# Patient Record
Sex: Male | Born: 1995
Health system: Southern US, Community
[De-identification: ages and names within clinical notes are randomized; demographics above are authoritative.]

## PROBLEM LIST (undated history)

## (undated) DIAGNOSIS — J301 Allergic rhinitis due to pollen: Secondary | ICD-10-CM

## (undated) DIAGNOSIS — F909 Attention-deficit hyperactivity disorder, unspecified type: Secondary | ICD-10-CM

## (undated) HISTORY — DX: Attention-deficit hyperactivity disorder, unspecified type: F90.9

## (undated) HISTORY — DX: Allergic rhinitis due to pollen: J30.1

---
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
## 2013-01-19 ENCOUNTER — Other Ambulatory Visit: Payer: Self-pay

## 2013-01-19 DIAGNOSIS — F909 Attention-deficit hyperactivity disorder, unspecified type: Secondary | ICD-10-CM

## 2013-01-19 MED ORDER — METHYLPHENIDATE HCL ER (OSM) 54 MG PO TBCR: 54.0000 mg | EXTENDED_RELEASE_TABLET | ORAL | Status: DC

## 2013-01-19 NOTE — Telephone Encounter (Signed)
Spoke with patient and advised rx ready for pick-up and it will be at the front desk.  

## 2013-01-19 NOTE — Telephone Encounter (Signed)
pts father left v/m requesting rx concerta. Call when ready for pick up.

## 2013-03-05 ENCOUNTER — Other Ambulatory Visit: Payer: Self-pay

## 2013-03-05 DIAGNOSIS — F909 Attention-deficit hyperactivity disorder, unspecified type: Secondary | ICD-10-CM

## 2013-03-05 NOTE — Telephone Encounter (Signed)
pts father left v/m requesting rx concerta. Call when ready for pick up.

## 2013-03-08 MED ORDER — METHYLPHENIDATE HCL ER (OSM) 54 MG PO TBCR: 54.0000 mg | EXTENDED_RELEASE_TABLET | ORAL | Status: DC

## 2013-03-08 NOTE — Telephone Encounter (Signed)
Left message on machine that rx is ready for pick-up, and it will be at our front desk.  

## 2013-04-12 ENCOUNTER — Other Ambulatory Visit: Payer: Self-pay

## 2013-04-12 DIAGNOSIS — F909 Attention-deficit hyperactivity disorder, unspecified type: Secondary | ICD-10-CM

## 2013-04-12 MED ORDER — METHYLPHENIDATE HCL ER (OSM) 54 MG PO TBCR
54.0000 mg | EXTENDED_RELEASE_TABLET | ORAL | Status: DC
Start: 2013-04-12 — End: 2013-05-13

## 2013-04-12 NOTE — Telephone Encounter (Signed)
pts father left v/m requesting rx concerta. Call when ready for pick up.

## 2013-04-13 NOTE — Telephone Encounter (Signed)
Tried calling home number, no answer and VM was full, will leave rx up front.

## 2013-05-13 ENCOUNTER — Other Ambulatory Visit: Payer: Self-pay

## 2013-05-13 DIAGNOSIS — F909 Attention-deficit hyperactivity disorder, unspecified type: Secondary | ICD-10-CM

## 2013-05-13 MED ORDER — METHYLPHENIDATE HCL ER (OSM) 54 MG PO TBCR: 54.0000 mg | EXTENDED_RELEASE_TABLET | ORAL | Status: DC

## 2013-05-13 NOTE — Telephone Encounter (Signed)
pts mother left v/m requesting rx Concerta. Call when ready for pick up.

## 2013-05-14 NOTE — Telephone Encounter (Signed)
Spoke with patient and advised rx ready for pick-up and it will be at the front desk.  

## 2013-06-16 ENCOUNTER — Other Ambulatory Visit: Payer: Self-pay

## 2013-06-16 ENCOUNTER — Encounter: Payer: 59 | Admitting: Internal Medicine

## 2013-06-16 DIAGNOSIS — Z0289 Encounter for other administrative examinations: Secondary | ICD-10-CM

## 2013-06-16 DIAGNOSIS — F909 Attention-deficit hyperactivity disorder, unspecified type: Secondary | ICD-10-CM

## 2013-06-16 MED ORDER — METHYLPHENIDATE HCL ER (OSM) 54 MG PO TBCR: 54.0000 mg | EXTENDED_RELEASE_TABLET | ORAL | Status: DC

## 2013-06-16 NOTE — Telephone Encounter (Signed)
Lm on pts vm informing him Rx is available at the front desk for pickup/ Pt advised he must pick up Rx as substance contract must be renewed

## 2013-06-16 NOTE — Telephone Encounter (Signed)
Printed.  Thanks.  

## 2013-06-16 NOTE — Telephone Encounter (Signed)
pts father left v/m requesting rx concerta; call when ready for pick up.

## 2013-06-17 ENCOUNTER — Encounter: Payer: Self-pay | Admitting: Internal Medicine

## 2013-07-12 ENCOUNTER — Encounter: Payer: Self-pay | Admitting: Internal Medicine

## 2013-07-20 ENCOUNTER — Other Ambulatory Visit: Payer: Self-pay | Admitting: *Deleted

## 2013-07-20 MED ORDER — METHYLPHENIDATE HCL ER (OSM) 54 MG PO TBCR: 54.0000 mg | EXTENDED_RELEASE_TABLET | ORAL | Status: DC

## 2013-07-20 NOTE — Telephone Encounter (Signed)
Request refill on Concerta. Call when ready for pickup.

## 2013-07-20 NOTE — Telephone Encounter (Signed)
Rx given to dad by Bonita QuinLinda She let him know that I cannot do anymore refills till he is back in the office for a visit

## 2013-07-26 ENCOUNTER — Encounter: Payer: Self-pay | Admitting: Internal Medicine

## 2013-07-26 ENCOUNTER — Encounter (INDEPENDENT_AMBULATORY_CARE_PROVIDER_SITE_OTHER): Payer: Self-pay

## 2013-07-26 ENCOUNTER — Ambulatory Visit (INDEPENDENT_AMBULATORY_CARE_PROVIDER_SITE_OTHER): Payer: 59 | Admitting: Internal Medicine

## 2013-07-26 VITALS — BP 128/80 | HR 83 | Temp 98.3°F | Ht 72.5 in | Wt 235.0 lb

## 2013-07-26 DIAGNOSIS — F900 Attention-deficit hyperactivity disorder, predominantly inattentive type: Secondary | ICD-10-CM

## 2013-07-26 DIAGNOSIS — F909 Attention-deficit hyperactivity disorder, unspecified type: Secondary | ICD-10-CM

## 2013-07-26 DIAGNOSIS — Z0000 Encounter for general adult medical examination without abnormal findings: Secondary | ICD-10-CM

## 2013-07-26 NOTE — Patient Instructions (Signed)
Exercise to Lose Weight Exercise and a healthy diet may help you lose weight. Your doctor may suggest specific exercises. EXERCISE IDEAS AND TIPS  Choose low-cost things you enjoy doing, such as walking, bicycling, or exercising to workout videos.  Take stairs instead of the elevator.  Walk during your lunch break.  Park your car further away from work or school.  Go to a gym or an exercise class.  Start with 5 to 10 minutes of exercise each day. Build up to 30 minutes of exercise 4 to 6 days a week.  Wear shoes with good support and comfortable clothes.  Stretch before and after working out.  Work out until you breathe harder and your heart beats faster.  Drink extra water when you exercise.  Do not do so much that you hurt yourself, feel dizzy, or get very short of breath. Exercises that burn about 150 calories:  Running 1  miles in 15 minutes.  Playing volleyball for 45 to 60 minutes.  Washing and waxing a car for 45 to 60 minutes.  Playing touch football for 45 minutes.  Walking 1  miles in 35 minutes.  Pushing a stroller 1  miles in 30 minutes.  Playing basketball for 30 minutes.  Raking leaves for 30 minutes.  Bicycling 5 miles in 30 minutes.  Walking 2 miles in 30 minutes.  Dancing for 30 minutes.  Shoveling snow for 15 minutes.  Swimming laps for 20 minutes.  Walking up stairs for 15 minutes.  Bicycling 4 miles in 15 minutes.  Gardening for 30 to 45 minutes.  Jumping rope for 15 minutes.  Washing windows or floors for 45 to 60 minutes. Document Released: 01/26/2010 Document Revised: 03/18/2011 Document Reviewed: 01/26/2010 ExitCare Patient Information 2015 ExitCare, LLC. This information is not intended to replace advice given to you by your health care provider. Make sure you discuss any questions you have with your health care provider.   DASH Eating Plan DASH stands for "Dietary Approaches to Stop Hypertension." The DASH eating plan  is a healthy eating plan that has been shown to reduce high blood pressure (hypertension). Additional health benefits may include reducing the risk of type 2 diabetes mellitus, heart disease, and stroke. The DASH eating plan may also help with weight loss. WHAT DO I NEED TO KNOW ABOUT THE DASH EATING PLAN? For the DASH eating plan, you will follow these general guidelines:  Choose foods with a percent daily value for sodium of less than 5% (as listed on the food label).  Use salt-free seasonings or herbs instead of table salt or sea salt.  Check with your health care provider or pharmacist before using salt substitutes.  Eat lower-sodium products, often labeled as "lower sodium" or "no salt added."  Eat fresh foods.  Eat more vegetables, fruits, and low-fat dairy products.  Choose whole grains. Look for the word "whole" as the first word in the ingredient list.  Choose fish and skinless chicken or turkey more often than red meat. Limit fish, poultry, and meat to 6 oz (170 g) each day.  Limit sweets, desserts, sugars, and sugary drinks.  Choose heart-healthy fats.  Limit cheese to 1 oz (28 g) per day.  Eat more home-cooked food and less restaurant, buffet, and fast food.  Limit fried foods.  Cook foods using methods other than frying.  Limit canned vegetables. If you do use them, rinse them well to decrease the sodium.  When eating at a restaurant, ask that your food be   prepared with less salt, or no salt if possible. WHAT FOODS CAN I EAT? Seek help from a dietitian for individual calorie needs. Grains Whole grain or whole wheat bread. Brown rice. Whole grain or whole wheat pasta. Quinoa, bulgur, and whole grain cereals. Low-sodium cereals. Corn or whole wheat flour tortillas. Whole grain cornbread. Whole grain crackers. Low-sodium crackers. Vegetables Fresh or frozen vegetables (raw, steamed, roasted, or grilled). Low-sodium or reduced-sodium tomato and vegetable juices.  Low-sodium or reduced-sodium tomato sauce and paste. Low-sodium or reduced-sodium canned vegetables.  Fruits All fresh, canned (in natural juice), or frozen fruits. Meat and Other Protein Products Ground beef (85% or leaner), grass-fed beef, or beef trimmed of fat. Skinless chicken or turkey. Ground chicken or turkey. Pork trimmed of fat. All fish and seafood. Eggs. Dried beans, peas, or lentils. Unsalted nuts and seeds. Unsalted canned beans. Dairy Low-fat dairy products, such as skim or 1% milk, 2% or reduced-fat cheeses, low-fat ricotta or cottage cheese, or plain low-fat yogurt. Low-sodium or reduced-sodium cheeses. Fats and Oils Tub margarines without trans fats. Light or reduced-fat mayonnaise and salad dressings (reduced sodium). Avocado. Safflower, olive, or canola oils. Natural peanut or almond butter. Other Unsalted popcorn and pretzels. The items listed above may not be a complete list of recommended foods or beverages. Contact your dietitian for more options. WHAT FOODS ARE NOT RECOMMENDED? Grains White bread. White pasta. White rice. Refined cornbread. Bagels and croissants. Crackers that contain trans fat. Vegetables Creamed or fried vegetables. Vegetables in a cheese sauce. Regular canned vegetables. Regular canned tomato sauce and paste. Regular tomato and vegetable juices. Fruits Dried fruits. Canned fruit in light or heavy syrup. Fruit juice. Meat and Other Protein Products Fatty cuts of meat. Ribs, chicken wings, bacon, sausage, bologna, salami, chitterlings, fatback, hot dogs, bratwurst, and packaged luncheon meats. Salted nuts and seeds. Canned beans with salt. Dairy Whole or 2% milk, cream, half-and-half, and cream cheese. Whole-fat or sweetened yogurt. Full-fat cheeses or blue cheese. Nondairy creamers and whipped toppings. Processed cheese, cheese spreads, or cheese curds. Condiments Onion and garlic salt, seasoned salt, table salt, and sea salt. Canned and packaged  gravies. Worcestershire sauce. Tartar sauce. Barbecue sauce. Teriyaki sauce. Soy sauce, including reduced sodium. Steak sauce. Fish sauce. Oyster sauce. Cocktail sauce. Horseradish. Ketchup and mustard. Meat flavorings and tenderizers. Bouillon cubes. Hot sauce. Tabasco sauce. Marinades. Taco seasonings. Relishes. Fats and Oils Butter, stick margarine, lard, shortening, ghee, and bacon fat. Coconut, palm kernel, or palm oils. Regular salad dressings. Other Pickles and olives. Salted popcorn and pretzels. The items listed above may not be a complete list of foods and beverages to avoid. Contact your dietitian for more information. WHERE CAN I FIND MORE INFORMATION? National Heart, Lung, and Blood Institute: www.nhlbi.nih.gov/health/health-topics/topics/dash/ Document Released: 12/13/2010 Document Revised: 12/29/2012 Document Reviewed: 10/28/2012 ExitCare Patient Information 2015 ExitCare, LLC. This information is not intended to replace advice given to you by your health care provider. Make sure you discuss any questions you have with your health care provider.  

## 2013-07-26 NOTE — Progress Notes (Signed)
Subjective:    Patient ID: Gerald Yang, male    DOB: 19-Mar-1995, 18 y.o.   MRN: [id number]  HPI Here for physical Finished first year at [employer] Did okay but did fail 2 courses Takes the med daily for school and as needed for weekends  Home now Working part time at [employer]  No social issues Discussed avoiding drugs and alcohol. Discussed safety and safe sex  Current Outpatient Prescriptions on File Prior to Visit  Medication Sig Dispense Refill  . methylphenidate 54 MG PO CR tablet Take 1 tablet (54 mg total) by mouth every morning.  30 tablet  0   No current facility-administered medications on file prior to visit.    No Known Allergies  Past Medical History  Diagnosis Date  . ADHD (attention deficit hyperactivity disorder)     Inattentive type  . Allergic rhinitis due to pollen     Past Surgical History  Procedure Laterality Date  . Lipoma excision  2012    in neck    Family History  Problem Relation Age of Onset  . Mitral valve prolapse Father   . Allergies Father   . Allergies Sister   . Diabetes Paternal Aunt   . Diabetes Paternal Grandmother 90  . Heart disease Neg Hx   . Cancer Other     lung cancer--?asbestosis    History   Social History  . Marital Status: Single    Spouse Name: N/A    Number of Children: N/A  . Years of Education: N/A   Occupational History  . Not on file.   Social History Main Topics  . Smoking status: Never Smoker   . Smokeless tobacco: Never Used  . Alcohol Use: No  . Drug Use: No  . Sexual Activity: No   Other Topics Concern  . 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       Discussed fitness-- he doesn't do any. Wears seat belt  HENT: Negative for dental problem, hearing loss and tinnitus.        Regular with dentist  Eyes:  Negative for visual disturbance.       No diplopia or unilateral vision loss  Respiratory: Negative for cough, chest tightness and shortness of breath.   Cardiovascular: Negative for chest pain, palpitations and leg swelling.  Gastrointestinal: Negative for nausea, vomiting, abdominal pain, constipation and blood in stool.  Endocrine: Negative for cold intolerance and heat intolerance.  Genitourinary: Negative for urgency, frequency and difficulty urinating.  Musculoskeletal: Negative for arthralgias, back pain and joint swelling.  Skin: Positive for rash.       Long standing eczema--- uses eucerin  Allergic/Immunologic: Positive for environmental allergies. Negative for immunocompromised state.       Cetirizine for spring  Neurological: Negative for dizziness, syncope, weakness, light-headedness, numbness and headaches.  Hematological: Negative for adenopathy. Does not bruise/bleed easily.  Psychiatric/Behavioral: Negative for sleep disturbance and dysphoric mood. The patient is not nervous/anxious.        Objective:   Physical Exam  Constitutional: He is oriented to person, place, and time. He appears well-developed and well-nourished. No distress.  HENT:  Head: Normocephalic and atraumatic.  Right Ear: External ear normal.  Left Ear: External ear normal.  Mouth/Throat: Oropharynx is clear and moist. No oropharyngeal exudate.  Eyes: Conjunctivae and  EOM are normal. Pupils are equal, round, and reactive to light.  Neck: Normal range of motion. Neck supple. No thyromegaly present.  Cardiovascular: Normal rate, regular rhythm, normal heart sounds and intact distal pulses.  Exam reveals no gallop.   No murmur heard. Pulmonary/Chest: Effort normal and breath sounds normal. No respiratory distress. He has no wheezes. He has no rales.  Abdominal: Soft. There is no tenderness.  Musculoskeletal: He exhibits no edema and no tenderness.  Lymphadenopathy:    He has no cervical adenopathy.    Neurological: He is alert and oriented to person, place, and time.  Skin: No rash noted. No erythema.  Psychiatric: He has a normal mood and affect. His behavior is normal.          Assessment & Plan:

## 2013-07-26 NOTE — Progress Notes (Signed)
Pre visit review using our clinic review tool, if applicable. No additional management support is needed unless otherwise documented below in the visit note. 

## 2013-07-26 NOTE — Assessment & Plan Note (Signed)
Needs for work now--and then for school Tolerates well and it helps

## 2013-07-26 NOTE — Assessment & Plan Note (Signed)
Healthy but needs to work on fitness Info given

## 2013-08-20 ENCOUNTER — Other Ambulatory Visit: Payer: Self-pay

## 2013-08-20 NOTE — Telephone Encounter (Signed)
pts mother left v/m requesting rx concerta. Call when ready for pick up. 

## 2013-08-23 MED ORDER — METHYLPHENIDATE HCL ER (OSM) 54 MG PO TBCR: 54.0000 mg | EXTENDED_RELEASE_TABLET | ORAL | Status: DC

## 2013-08-23 NOTE — Telephone Encounter (Signed)
.  left message to have patient return my call.  

## 2013-09-30 ENCOUNTER — Other Ambulatory Visit: Payer: Self-pay

## 2013-09-30 NOTE — Telephone Encounter (Signed)
pts mother left v/m requesting rx concerta. Call when ready for pick up. 

## 2013-10-01 MED ORDER — METHYLPHENIDATE HCL ER (OSM) 54 MG PO TBCR: 54.0000 mg | EXTENDED_RELEASE_TABLET | ORAL | Status: DC

## 2013-10-01 NOTE — Telephone Encounter (Signed)
Spoke with patient and advised rx ready for pick-up and it will be at the front desk.  

## 2013-11-01 ENCOUNTER — Other Ambulatory Visit: Payer: Self-pay

## 2013-11-01 NOTE — Telephone Encounter (Signed)
pts mother left v/m requesting rx concerta. Call when ready for pick up.

## 2013-11-02 MED ORDER — METHYLPHENIDATE HCL ER (OSM) 54 MG PO TBCR: 54.0000 mg | EXTENDED_RELEASE_TABLET | ORAL | Status: DC

## 2013-11-02 NOTE — Telephone Encounter (Signed)
Left message on machine that rx is ready for pick-up, and it will be at our front desk.  

## 2013-12-06 ENCOUNTER — Other Ambulatory Visit: Payer: Self-pay

## 2013-12-06 MED ORDER — METHYLPHENIDATE HCL ER (OSM) 54 MG PO TBCR: 54.0000 mg | EXTENDED_RELEASE_TABLET | ORAL | Status: DC

## 2013-12-06 NOTE — Telephone Encounter (Signed)
pts mother Renetta left v/m requesting rx concerta. Call when ready for pick up.

## 2013-12-07 NOTE — Telephone Encounter (Signed)
Left message on machine that rx is ready for pick-up, and it will be at our front desk.  

## 2014-01-14 ENCOUNTER — Other Ambulatory Visit: Payer: Self-pay

## 2014-01-14 MED ORDER — METHYLPHENIDATE HCL ER (OSM) 54 MG PO TBCR: 54.0000 mg | EXTENDED_RELEASE_TABLET | ORAL | Status: DC

## 2014-01-14 NOTE — Telephone Encounter (Signed)
Spoke with patient and advised rx ready for pick-up and it will be at the front desk.  

## 2014-01-14 NOTE — Telephone Encounter (Signed)
pts mother left v/m requesting rx for concerta. Call when ready for pick up. Pt has 6 mth f/u appt 01/20/2014.

## 2014-01-20 ENCOUNTER — Encounter: Payer: Self-pay | Admitting: Internal Medicine

## 2014-01-20 ENCOUNTER — Ambulatory Visit (INDEPENDENT_AMBULATORY_CARE_PROVIDER_SITE_OTHER): Payer: Self-pay | Admitting: Internal Medicine

## 2014-01-20 VITALS — BP 128/84 | HR 96 | Temp 98.5°F | Wt 228.8 lb

## 2014-01-20 DIAGNOSIS — F900 Attention-deficit hyperactivity disorder, predominantly inattentive type: Secondary | ICD-10-CM

## 2014-01-20 NOTE — Progress Notes (Signed)
Pre visit review using our clinic review tool, if applicable. No additional management support is needed unless otherwise documented below in the visit note. 

## 2014-01-20 NOTE — Assessment & Plan Note (Signed)
Failed at [employer] Now going to [employer] May be better suited for vocational trade program---urged him to consider this and meet with vocational counselor

## 2014-01-20 NOTE — Progress Notes (Signed)
   Subjective:    Patient ID: Gerald Yang, male    DOB: Aug 28, 1995, 19 y.o.   MRN: [id number]  HPI Here for follow up of ADHD  Did do the fall semester at [employer] but didn't do well Failed everything and is taking the spring off "It was a tough schedule that I just wasn't ready for"  Working now-- or looking Will start at [employer] for general courses and hope to go back if things have improved  No drugs or alcohol No relationship issues  Has continued to use the medication but doesn't relate his failure to the inattention Discussed vocational education--maybe he should be training for a trade  Current Outpatient Prescriptions on File Prior to Visit  Medication Sig Dispense Refill  . methylphenidate 54 MG PO CR tablet Take 1 tablet (54 mg total) by mouth every morning. 30 tablet 0   No current facility-administered medications on file prior to visit.    No Known Allergies  Past Medical History  Diagnosis Date  . ADHD (attention deficit hyperactivity disorder)     Inattentive type  . Allergic rhinitis due to pollen     Past Surgical History  Procedure Laterality Date  . Lipoma excision  2012    in neck    Family History  Problem Relation Age of Onset  . Mitral valve prolapse Father   . Allergies Father   . Allergies Sister   . Diabetes Paternal Aunt   . Diabetes Paternal Grandmother [id number]  . Heart disease Neg Hx   . Cancer Other     lung cancer--?asbestosis    History   Social History  . Marital Status: Single    Spouse Name: N/A    Number of Children: N/A  . Years of Education: N/A   Occupational History  . Not on file.   Social History Main Topics  . Smoking status: Never Smoker   . Smokeless tobacco: Never Used  . Alcohol Use: No  . Drug Use: No  . Sexual Activity: No   Other Topics Concern  . 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 His behavior is normal. Thought content normal.          Assessment & Plan:

## 2014-01-24 ENCOUNTER — Ambulatory Visit: Payer: Self-pay | Admitting: Internal Medicine

## 2014-01-26 ENCOUNTER — Encounter: Payer: Self-pay | Admitting: Family Medicine

## 2014-01-26 ENCOUNTER — Ambulatory Visit (INDEPENDENT_AMBULATORY_CARE_PROVIDER_SITE_OTHER): Payer: 59 | Admitting: Family Medicine

## 2014-01-26 VITALS — BP 110/60 | HR 95 | Temp 98.8°F | Ht 72.5 in | Wt 229.8 lb

## 2014-01-26 DIAGNOSIS — L723 Sebaceous cyst: Secondary | ICD-10-CM

## 2014-01-26 NOTE — Progress Notes (Signed)
   Dr. Karleen HampshireSpencer T. Gao Mitnick, MD, CAQ Sports [facility] [address] [place], [id number] Phone: [phone] Fax: [phone]  01/26/2014  Patient: Gerald SchmidtJonathan A Yang, MRN: [id number], DOB: 05-Feb-1995, 19 y.o.  Primary Physician:  Tillman Abideichard Letvak, MD  Chief Complaint: Spot on Groin  Subjective:   Gerald Yang is a 19 y.o. very pleasant male patient who presents with the following:  Over the last week, the patient is noticed a large mid or area on his groin on the right, and he has never seen this before.  He has no known sexual exposures. He has never been sexually active. It hurts mildly. It is not an open sore.  Past Medical History, Surgical History, Social History, Family History, Problem List, Medications, and Allergies have been reviewed and updated if relevant.   GEN: No acute illnesses, no fevers, chills. GI: No n/v/d, eating normally Pulm: No SOB Interactive and getting along well at home.  Otherwise, ROS is as per the HPI.  Objective:   BP 110/60 mmHg  Pulse 95  Temp(Src) 98.8 F (37.1 C) (Oral)  Ht 6' 0.5" (1.842 m)  Wt 229 lb 12 oz (104.214 kg)  BMI 30.71 kg/m2   GEN: WDWN, NAD, Non-toxic, Alert & Oriented x 3 HEENT: Atraumatic, Normocephalic.  Ears and Nose: No external deformity. EXTR: No clubbing/cyanosis/edema NEURO: Normal gait.  PSYCH: Normally interactive. Conversant. Not depressed or anxious appearing.  Calm demeanor.  GU: normal male. On the right side of the patient's testicular sac there is a. It is approximately the size of a pea that is elevated and mildly tender to palpation. There is no significant adjacent redness or inflammation.  Laboratory and Imaging Data:  Assessment and Plan:   Sebaceous cyst of scrotum  Relatively small and large sebaceous cyst, I think it can be followed, and treated conservatively. Recommended warm, moist compresses multiple times a day to the area without clothing.  At this point, do not think that it needs to be opened or needs antibiotics.  Reassured the patient. If this gets quite a bit larger or more painful, then more intervention would be appropriate.  Signed,  Elpidio GaleaSpencer T. Indi Willhite, MD   Patient's Medications  New Prescriptions   No medications on file  Previous Medications   METHYLPHENIDATE 54 MG PO CR TABLET    Take 1 tablet (54 mg total) by mouth every morning.  Modified Medications   No medications on file  Discontinued Medications   No medications on file

## 2014-01-26 NOTE — Progress Notes (Signed)
Pre visit review using our clinic review tool, if applicable. No additional management support is needed unless otherwise documented below in the visit note. 

## 2014-02-11 ENCOUNTER — Other Ambulatory Visit: Payer: Self-pay

## 2014-02-11 NOTE — Telephone Encounter (Signed)
Pt left v/m requesting rx concerta.call when ready for pick up.pt seen 01/20/14.

## 2014-02-12 NOTE — Telephone Encounter (Signed)
Approved: okay to prepare Rx #[id number] x 0 and I will sign when I get in on Monday morning

## 2014-02-14 MED ORDER — METHYLPHENIDATE HCL ER (OSM) 54 MG PO TBCR: 54.0000 mg | EXTENDED_RELEASE_TABLET | ORAL | Status: DC

## 2014-02-14 NOTE — Telephone Encounter (Signed)
Left message on voicemail [facility] that script is up front ready for pickup.

## 2014-02-14 NOTE — Telephone Encounter (Signed)
Rx printed and will have Dr Alphonsus SiasLetvak sign before calling pt

## 2014-08-10 ENCOUNTER — Encounter: Payer: Self-pay | Admitting: Internal Medicine

## 2014-08-10 ENCOUNTER — Telehealth: Payer: Self-pay | Admitting: Internal Medicine

## 2014-08-10 NOTE — Telephone Encounter (Signed)
Patient did not come in for their appointment today for CPE .  Please let me know if patient needs to be contacted immediately for follow up or no follow up needed. °

## 2014-08-11 NOTE — Telephone Encounter (Signed)
He needs to reschedule the PE within the next 1 month or so--- we need to review his prescription

## 2014-08-15 NOTE — Telephone Encounter (Signed)
L/m for pt to return call, need to rs cpe

## 2014-08-18 NOTE — Telephone Encounter (Signed)
Called to r/s appt, no answer, kept ringing  °

## 2014-08-23 NOTE — Telephone Encounter (Signed)
L/m for pt to return call to rs appt

## 2016-01-14 ENCOUNTER — Emergency Department (HOSPITAL_BASED_OUTPATIENT_CLINIC_OR_DEPARTMENT_OTHER)
Admission: EM | Admit: 2016-01-14 | Discharge: 2016-01-14 | Disposition: A | Discharge status: Home / Self Care | Payer: Commercial Managed Care - HMO | Attending: Emergency Medicine | Admitting: Emergency Medicine

## 2016-01-14 ENCOUNTER — Encounter (HOSPITAL_BASED_OUTPATIENT_CLINIC_OR_DEPARTMENT_OTHER): Payer: Self-pay | Admitting: *Deleted

## 2016-01-14 ENCOUNTER — Emergency Department (HOSPITAL_BASED_OUTPATIENT_CLINIC_OR_DEPARTMENT_OTHER): Payer: Commercial Managed Care - HMO

## 2016-01-14 DIAGNOSIS — R1030 Lower abdominal pain, unspecified: Secondary | ICD-10-CM | POA: Diagnosis present

## 2016-01-14 DIAGNOSIS — F900 Attention-deficit hyperactivity disorder, predominantly inattentive type: Secondary | ICD-10-CM | POA: Insufficient documentation

## 2016-01-14 DIAGNOSIS — N200 Calculus of kidney: Secondary | ICD-10-CM | POA: Diagnosis not present

## 2016-01-14 LAB — BASIC METABOLIC PANEL
Anion gap: 9 (ref 5–15)
BUN: 16 mg/dL (ref 6–20)
CALCIUM: 9.6 mg/dL (ref 8.9–10.3)
CHLORIDE: 104 mmol/L (ref 101–111)
CO2: 27 mmol/L (ref 22–32)
CREATININE: 1.2 mg/dL (ref 0.61–1.24)
GFR calc Af Amer: >60 mL/min (ref >60)
GFR calc non Af Amer: >60 mL/min (ref >60)
Glucose, Bld: 101 mg/dL — ABNORMAL HIGH (ref 65–99)
Potassium: 4.3 mmol/L (ref 3.5–5.1)
Sodium: 140 mmol/L (ref 135–145)

## 2016-01-14 LAB — URINALYSIS, MICROSCOPIC (REFLEX): RBC / HPF: NONE SEEN RBC/hpf (ref 0–5)

## 2016-01-14 LAB — URINALYSIS, ROUTINE W REFLEX MICROSCOPIC
Bilirubin Urine: NEGATIVE
Glucose, UA: NEGATIVE mg/dL
HGB URINE DIPSTICK: NEGATIVE
Ketones, ur: NEGATIVE mg/dL
Leukocytes, UA: NEGATIVE
Nitrite: NEGATIVE
PROTEIN: NEGATIVE mg/dL
Specific Gravity, Urine: 1.036 — ABNORMAL HIGH (ref 1.005–1.030)
pH: 5 (ref 5.0–8.0)

## 2016-01-14 LAB — CBC WITH DIFFERENTIAL/PLATELET
BASOS PCT: 0 %
Basophils Absolute: 0 10*3/uL (ref 0.0–0.1)
EOS PCT: 0 %
Eosinophils Absolute: 0 10*3/uL (ref 0.0–0.7)
HCT: 44.7 % (ref 39.0–52.0)
Hemoglobin: 15.1 g/dL (ref 13.0–17.0)
LYMPHS ABS: 1.2 10*3/uL (ref 0.7–4.0)
Lymphocytes Relative: 7 %
MCH: 30.9 pg (ref 26.0–34.0)
MCHC: 33.8 g/dL (ref 30.0–36.0)
MCV: 91.4 fL (ref 78.0–100.0)
MONO ABS: 0.9 10*3/uL (ref 0.1–1.0)
MONOS PCT: 5 %
NEUTROS PCT: 88 %
Neutro Abs: 14.4 10*3/uL — ABNORMAL HIGH (ref 1.7–7.7)
PLATELETS: 230 10*3/uL (ref 150–400)
RBC: 4.89 MIL/uL (ref 4.22–5.81)
RDW: 12 % (ref 11.5–15.5)
WBC: 16.5 10*3/uL — ABNORMAL HIGH (ref 4.0–10.5)

## 2016-01-14 MED ORDER — HYDROCODONE-ACETAMINOPHEN 5-325 MG PO TABS: 1.0000 | ORAL_TABLET | Freq: Four times a day (QID) | ORAL | 0 refills | Status: DC | PRN

## 2016-01-14 NOTE — ED Triage Notes (Signed)
Left groin pain and urinary retention.  [facility] and zofran.   No blood in urine.

## 2016-01-14 NOTE — ED Notes (Signed)
Patient transported to Ultrasound 

## 2016-01-14 NOTE — ED Provider Notes (Signed)
[facility] Provider Note   CSN: [id number] Arrival date & time: 01/14/16  1812  By signing my name below, I, Teofilo Pod, attest that this documentation has been prepared under the direction and in the presence of Gwyneth Sprout, MD . Electronically Signed: Teofilo Pod, ED Scribe. 01/14/2016. 7:20 PM.    History   Chief Complaint Chief Complaint  Patient presents with  . Groin Pain    The history is provided by the patient. No language interpreter was used.   HPI Comments:  Gerald Yang is a 21 y.o. male who presents to the Emergency Department complaining of sudden onset, left sided abdominal pain x 5-6 hours. Pt states that the pain is non-radiating, and describes it as pressure in the groin area. Pt complains of associated minimal testicular pain, difficulty urinating, and 1 episode of vomiting at the onset of the pain. Pt denies any hx of previous similar symptoms. Pt was seen at urgent care and was given Toradol and zofran with mild relief. Pt denies penile pain.   Past Medical History:  Diagnosis Date  . ADHD (attention deficit hyperactivity disorder)    Inattentive type  . Allergic rhinitis due to pollen     Patient Active Problem List   Diagnosis Date Noted  . Routine general medical examination at a health care facility 11/20/2011  . Allergic rhinitis due to pollen   . ADHD, predominantly inattentive type 06/11/2010    Past Surgical History:  Procedure Laterality Date  . LIPOMA EXCISION  2012   in neck       Home Medications    Prior to Admission medications   Not on File    Family History Family History  Problem Relation Age of Onset  . Mitral valve prolapse Father   . Allergies Father   . Allergies Sister   . Diabetes Paternal Aunt   . Diabetes Paternal Grandmother 62  . Heart disease Neg Hx   . Cancer Other     lung cancer--?asbestosis    Social History Social History  Substance Use Topics  . Smoking  status: Never Smoker  . Smokeless tobacco: Never Used  . Alcohol use No     Allergies   Patient has no known allergies.   Review of Systems Review of Systems  Gastrointestinal: Positive for abdominal pain and vomiting.  Genitourinary: Positive for difficulty urinating and flank pain. Negative for penile pain.  All other systems reviewed and are negative.    Physical Exam Updated Vital Signs BP 127/89 (BP Location: Left Arm)   Pulse 75   Temp 98.4 F (36.9 C) (Oral)   Resp 20   Ht 6\' 1"  (1.854 m)   Wt 246 lb (111.6 kg)   SpO2 100%   BMI 32.46 kg/m   Physical Exam  Constitutional: He appears well-developed and well-nourished. No distress.  HENT:  Head: Normocephalic and atraumatic.  Eyes: Conjunctivae are normal.  Cardiovascular: Normal rate.   Pulmonary/Chest: Effort normal.  No CVA tenderness.   Abdominal: He exhibits no distension. There is no rebound and no guarding.  Neurological: He is alert.  Skin: Skin is warm and dry.  Psychiatric: He has a normal mood and affect.  Nursing note and vitals reviewed.    ED Treatments / Results  DIAGNOSTIC STUDIES:  Oxygen Saturation is 100% on RA, normal by my interpretation.    COORDINATION OF CARE:  7:20 PM Discussed treatment plan with pt at bedside and pt agreed to plan.  Labs (all labs ordered are listed, but only abnormal results are displayed) Labs Reviewed  URINALYSIS, ROUTINE W REFLEX MICROSCOPIC - Abnormal; Notable for the following:       Result Value   APPearance TURBID (*)    Specific Gravity, Urine 1.036 (*)    All other components within normal limits  URINALYSIS, MICROSCOPIC (REFLEX) - Abnormal; Notable for the following:    Bacteria, UA RARE (*)    Squamous Epithelial / LPF 0-5 (*)    All other components within normal limits    EKG  EKG Interpretation None       Radiology [place]al  Result Date: 01/14/2016 CLINICAL DATA:  Left flank pain radiates to left scrotum x 5 hours with  painful urination. Pain has since eased off. No hematuria. EXAM: RENAL / URINARY TRACT ULTRASOUND COMPLETE COMPARISON:  None. FINDINGS: Right Kidney: Length: 10.8 cm. Echogenicity within normal limits. No mass or hydronephrosis visualized. Left Kidney: Length: 12.3 cm. Normal parenchymal echogenicity. No mass or stone. Mild hydronephrosis. Bladder: Mildly distended. No bladder mass or wall thickening. Bilateral ureteral jets noted. IMPRESSION: 1. Mild left hydronephrosis. This may reflect a mildly obstructing distal stone or a recently passed stone. 2. No other abnormalities. Electronically Signed   By: Amie Portlandavid  Ormond M.D.   On: 01/14/2016 19:55    Procedures Procedures (including critical care time)  Medications Ordered in ED Medications - No data to display   Initial Impression / Assessment and Plan / ED Course  I have reviewed the triage vital signs and the nursing notes.  Pertinent labs & imaging results that were available during my care of the patient were reviewed by me and considered in my medical decision making (see chart for details).  Clinical Course    Pt with symptoms consistent with kidney stone.  Denies infectious sx, or GI symptoms.  Low concern for diverticulitis and no risk factors or history suggestive of AAA.  No hx suggestive of GU source (discharge) and otherwise pt is healthy.  Will hydrate, treat pain and ensure no infection with UA, CBC, CMP and will get stone study to further eval.  UA without infection.  US with mild hydronephrosis.  Will d/c with pain control and urology f/u if stone does not pass   Final Clinical Impressions(s) / ED Diagnoses   Final diagnoses:  Kidney stone on left side    New Prescriptions New Prescriptions   HYDROCODONE-ACETAMINOPHEN (NORCO/VICODIN) 5-325 MG TABLET    Take 1-2 tablets by mouth every 6 (six) hours as needed.   I personally performed the services described in this documentation, which was scribed in my presence.  The  recorded information has been reviewed and considered.     Gwyneth SproutWhitney Man Effertz, MD 01/14/16 2005

## 2016-01-16 ENCOUNTER — Telehealth: Payer: Self-pay

## 2016-01-16 NOTE — Telephone Encounter (Signed)
Pt was seen 01/24/16 in ED with Kidney stone. Pt has had nausea and wants to know if can get med for nausea. Pt last saw Dr Alphonsus SiasLetvak 01/2014; pt did not want to make appt; pt to see urologist if does not get better; advised pt will need to contact urologist. Pt voiced understanding.

## 2017-08-18 IMAGING — US US RENAL
1 series · 14 of 25 positions shown · non-contrast
Comparison: None.

CLINICAL DATA: Left flank pain radiates to left scrotum x 5 hours
with painful urination. Pain has since eased off. No hematuria.

EXAM:
RENAL / URINARY TRACT ULTRASOUND COMPLETE

[Series 1: us renal · 0.27mm/px · 14 of 31 slices shown]
[im 1/31]
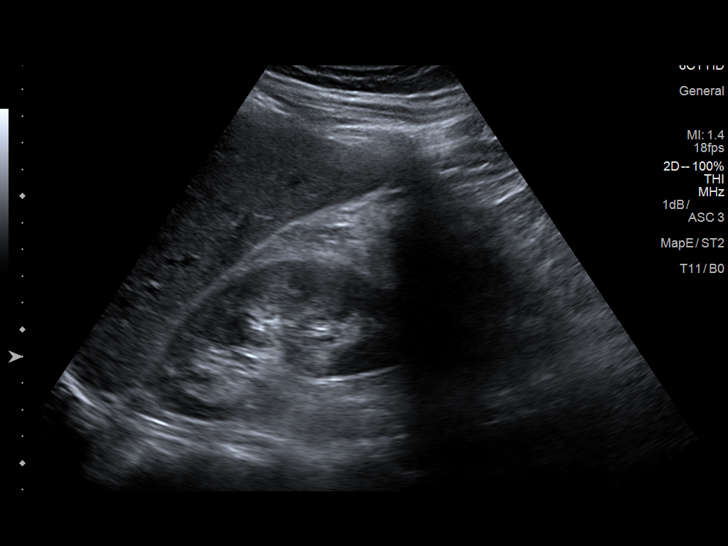
[im 3/31]
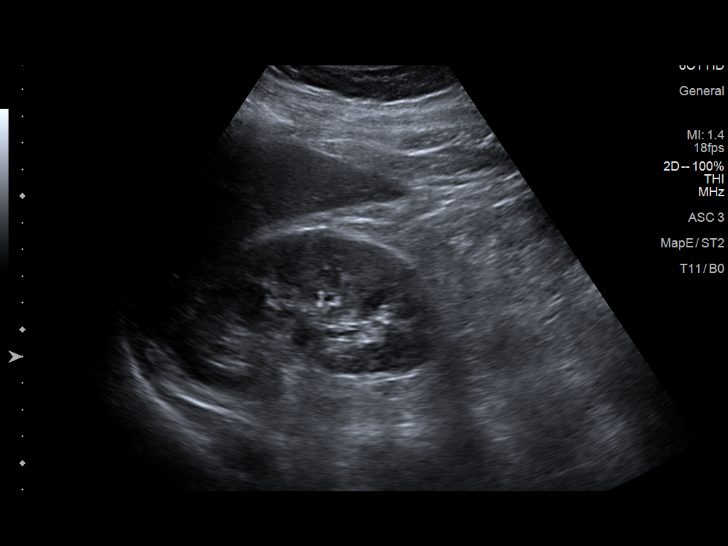
[im 6/31]
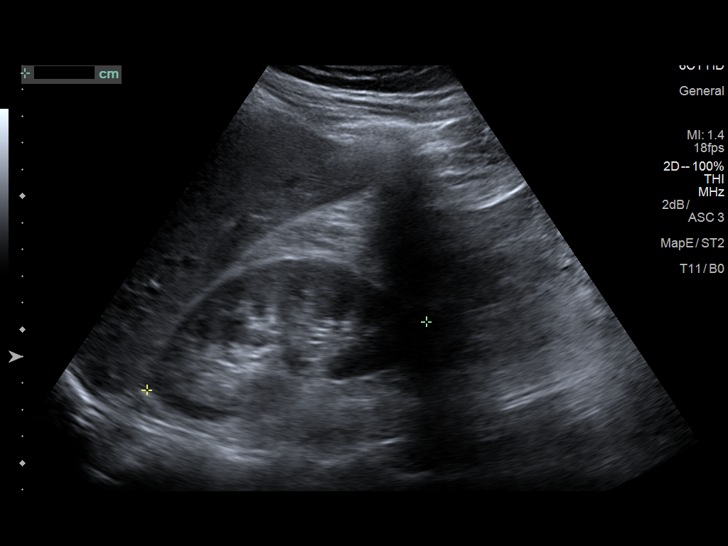
[im 8/31]
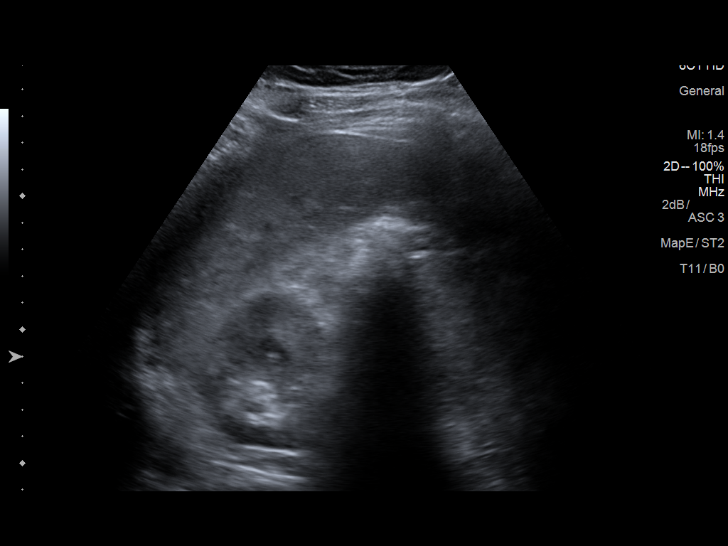
[im 11/31]
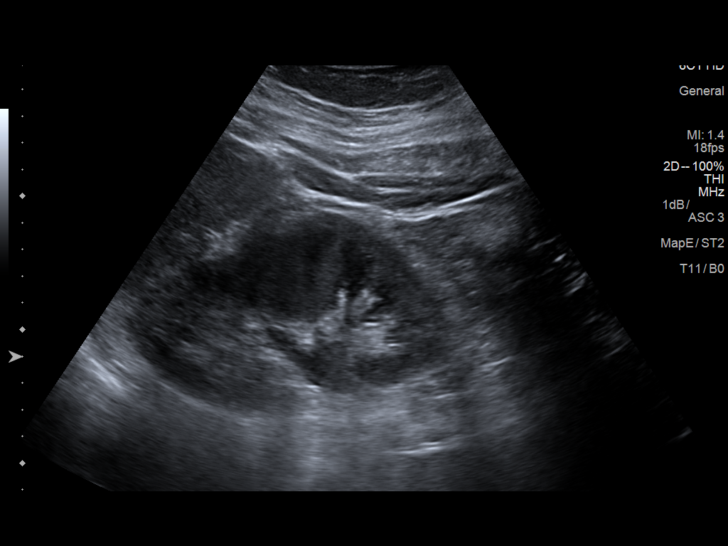
[im 12/31]
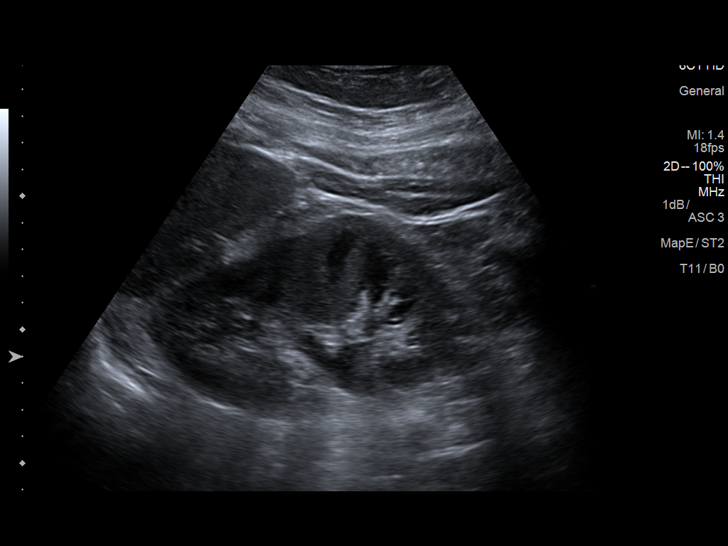
[im 14/31]
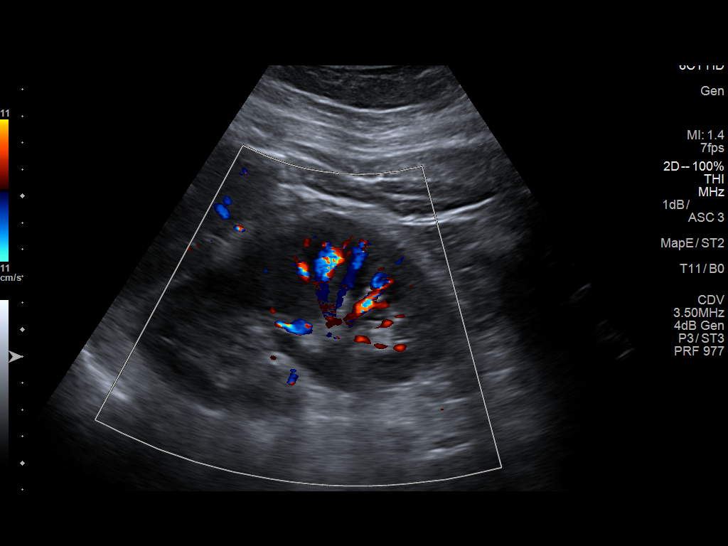
[im 17/31]
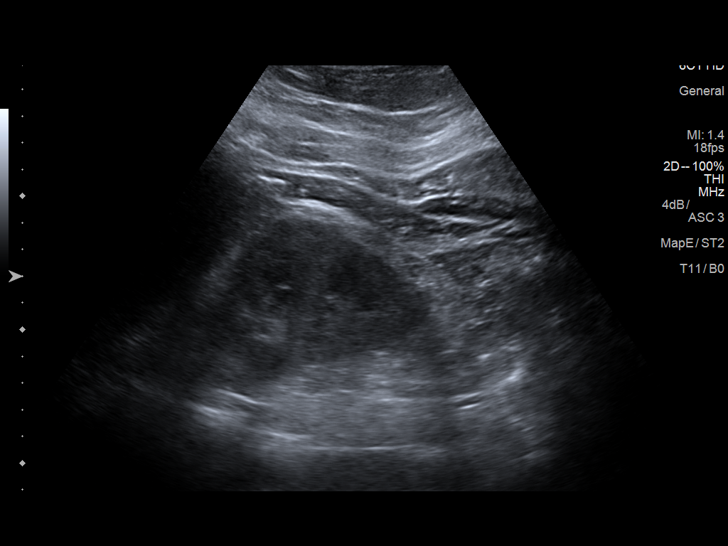
[im 19/31]
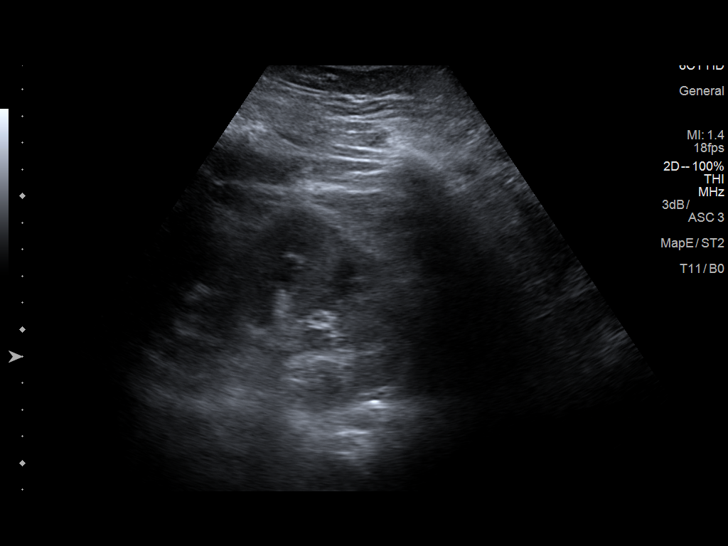
[im 21/31]
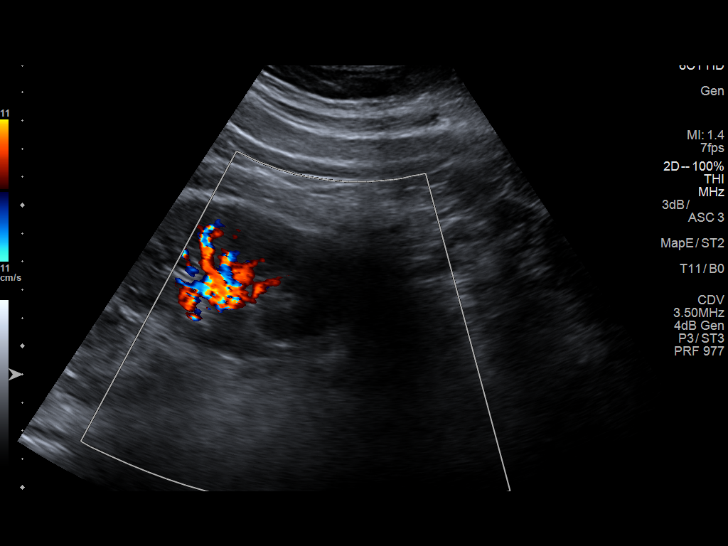
[im 23/31]
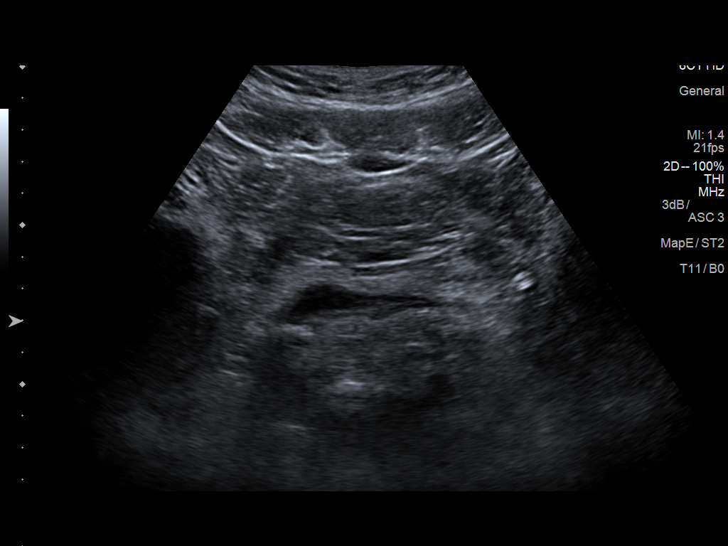
[im 26/31]
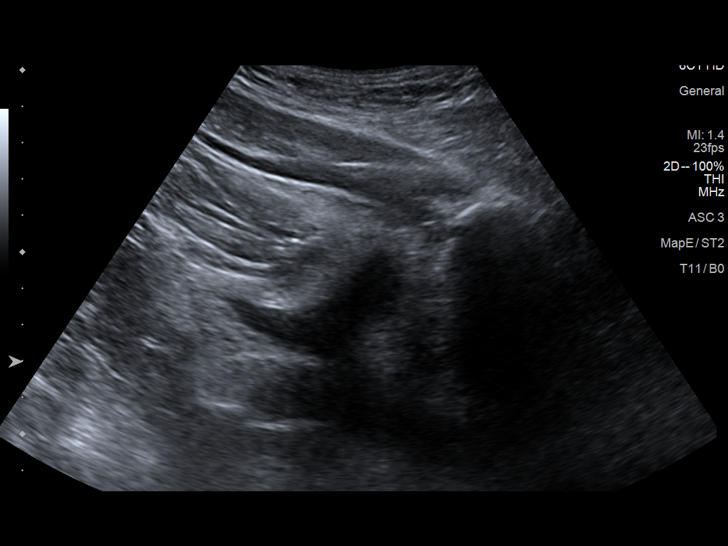
[im 28/31]
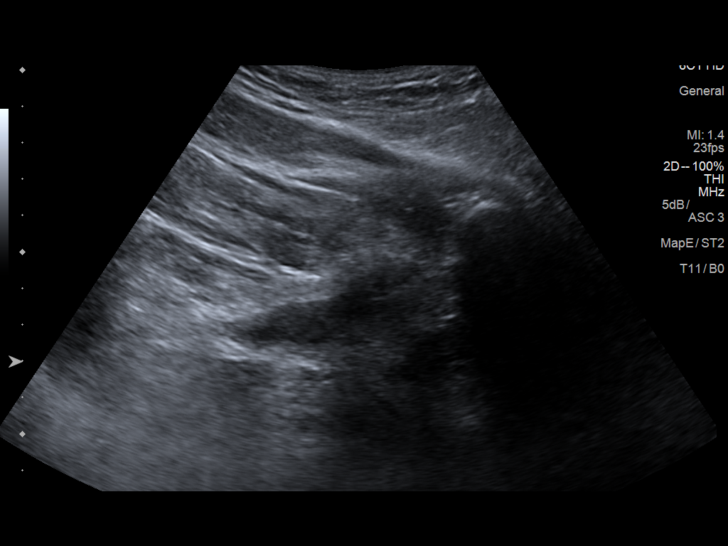
[im 31/31]
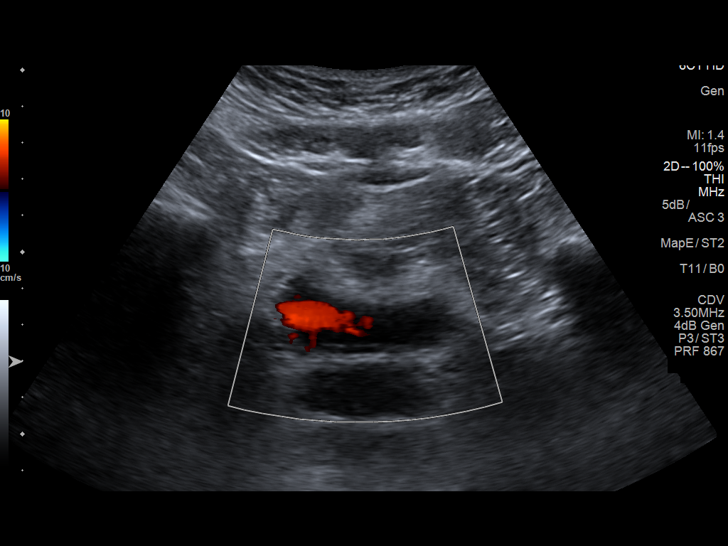

[14 of 25 positions shown; findings below may reference images not displayed]

FINDINGS: Right Kidney:

Length: 10.8 cm. Echogenicity within normal limits. No mass or
hydronephrosis visualized.

Left Kidney:

Length: 12.3 cm. Normal parenchymal echogenicity. No mass or stone.
Mild hydronephrosis.

Bladder:

Mildly distended. No bladder mass or wall thickening. Bilateral
ureteral jets noted.
IMPRESSION: 1. Mild left hydronephrosis. This may reflect a mildly obstructing
distal stone or a recently passed stone.
2. No other abnormalities.

## 2018-09-01 ENCOUNTER — Other Ambulatory Visit: Payer: Self-pay

## 2018-09-01 ENCOUNTER — Encounter: Payer: Self-pay | Admitting: Internal Medicine

## 2018-09-01 ENCOUNTER — Ambulatory Visit: Payer: 59 | Admitting: Internal Medicine

## 2018-09-01 VITALS — BP 114/78 | HR 58 | Temp 98.7°F | Ht 72.25 in | Wt 256.0 lb

## 2018-09-01 DIAGNOSIS — Z0000 Encounter for general adult medical examination without abnormal findings: Secondary | ICD-10-CM | POA: Diagnosis not present

## 2018-09-01 DIAGNOSIS — Z23 Encounter for immunization: Secondary | ICD-10-CM | POA: Diagnosis not present

## 2018-09-01 NOTE — Patient Instructions (Signed)

## 2018-09-01 NOTE — Addendum Note (Signed)
Addended by: Pilar Grammes on: 09/01/2018 10:37 AM   Modules accepted: Orders

## 2018-09-01 NOTE — Progress Notes (Signed)
Subjective:    Patient ID: Gerald Yang, male    DOB: 1995/03/24, 23 y.o.   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   No current facility-administered medications on file prior to visit.     No Known Allergies  Past Medical History:  Diagnosis Date  . ADHD (attention deficit hyperactivity disorder)    Inattentive type  . Allergic rhinitis due to pollen     Past Surgical History:  Procedure Laterality Date  . LIPOMA EXCISION  2012   in neck    Family History  Problem Relation Age of Onset  . Stroke Mother        TIA  . Hypertension Mother   . Mitral valve prolapse Father   . Allergies Father   . Allergies Sister   . Cancer Other        lung cancer--?asbestosis  . Diabetes Paternal Aunt   . Diabetes Paternal Grandmother 3  . Heart disease Neg Hx    Review of Systems  Constitutional: Positive for unexpected weight change. Negative for fatigue.       Not exercising--discussed  Wears seat belt  HENT: Negative for dental problem, hearing loss and tinnitus.        Keeps up with dentist  Eyes: Positive for visual disturbance.       Has corneal flattening---seeing eye doctor due to changes  Respiratory: Negative for cough, chest tightness and shortness of breath.   Cardiovascular: Negative for chest pain, palpitations and leg swelling.  Gastrointestinal: Negative for abdominal pain, blood in stool and constipation.       No heartburn   Endocrine: Negative for polydipsia and polyuria.  Genitourinary: Negative for difficulty urinating and urgency.       No sexual issues  Musculoskeletal: Negative for arthralgias, back pain and joint swelling.  Skin:       Eczema on  legs---eucerin helps  Allergic/Immunologic: Negative for environmental allergies and immunocompromised state.  Neurological: Negative for dizziness, syncope, light-headedness and headaches.  Hematological: Negative for adenopathy. Does not bruise/bleed easily.  Psychiatric/Behavioral: Negative for dysphoric mood and sleep disturbance. The patient is not nervous/anxious.        Objective:   Physical Exam  Constitutional: He is oriented to person, place, and time. He appears well-developed. No distress.  HENT:  Head: Normocephalic and atraumatic.  Right Ear: External ear normal.  Left Ear: External ear normal.  Mouth/Throat: Oropharynx is clear and moist. No oropharyngeal exudate.  Eyes: Pupils are equal, round, and reactive to light. Conjunctivae are normal.  Neck: No thyromegaly present.  No mass or nodes in neck (large healed scar on left)  Cardiovascular: Normal rate, regular rhythm, normal heart sounds and intact distal pulses. Exam reveals no gallop.  No murmur heard. Respiratory: Effort normal and breath sounds normal. No respiratory distress. He has no wheezes. He has no rales.  GI: Soft. There is no abdominal tenderness.  Genitourinary:    Genitourinary Comments: Normal testes    Musculoskeletal:        General: No tenderness or edema.  Lymphadenopathy:    He has no cervical adenopathy.  Neurological: He is alert and oriented to person, place, and time.  Skin: No rash noted. No erythema.  Psychiatric: He has a normal mood and affect. His behavior is normal.           Assessment & Plan:

## 2018-09-01 NOTE — Assessment & Plan Note (Signed)
Healthy but needs to work on fitness DASH info Td booster Prefers no flu vaccine--discussed

## 2018-11-20 ENCOUNTER — Other Ambulatory Visit: Payer: Self-pay | Admitting: *Deleted

## 2018-11-20 DIAGNOSIS — Z20822 Contact with and (suspected) exposure to covid-19: Secondary | ICD-10-CM

## 2018-11-22 LAB — NOVEL CORONAVIRUS, NAA: SARS-CoV-2, NAA: NOT DETECTED

## 2020-01-22 ENCOUNTER — Other Ambulatory Visit: Payer: Self-pay

## 2020-01-22 DIAGNOSIS — Z20822 Contact with and (suspected) exposure to covid-19: Secondary | ICD-10-CM

## 2020-01-25 LAB — NOVEL CORONAVIRUS, NAA: SARS-CoV-2, NAA: NOT DETECTED

## 2020-02-22 ENCOUNTER — Encounter: Payer: Self-pay | Admitting: Internal Medicine

## 2020-02-25 ENCOUNTER — Encounter: Payer: Self-pay | Admitting: Internal Medicine

## 2020-05-08 ENCOUNTER — Ambulatory Visit
Admission: RE | Admit: 2020-05-08 | Discharge: 2020-05-08 | Disposition: A | Discharge status: Home / Self Care | Payer: BLUE CROSS/BLUE SHIELD | Source: Ambulatory Visit | Attending: Family Medicine | Admitting: Family Medicine

## 2020-05-08 ENCOUNTER — Other Ambulatory Visit: Payer: Self-pay

## 2020-05-08 VITALS — BP 111/76 | HR 89 | Temp 98.7°F | Resp 20

## 2020-05-08 DIAGNOSIS — M542 Cervicalgia: Secondary | ICD-10-CM

## 2020-05-08 MED ORDER — TIZANIDINE HCL 4 MG PO TABS: 4.0000 mg | ORAL_TABLET | Freq: Every day | ORAL | 0 refills | Status: DC

## 2020-05-08 MED ORDER — PREDNISONE 20 MG PO TABS: 20.0000 mg | ORAL_TABLET | Freq: Every day | ORAL | 0 refills | Status: AC

## 2020-05-08 NOTE — ED Triage Notes (Signed)
Pt presents with c/o neck pain for past 2 weeks, denies injury

## 2020-05-08 NOTE — ED Provider Notes (Signed)
[facility]    CSN: [id number] Arrival date & time: 05/08/20  1348      History   Chief Complaint Chief Complaint  Patient presents with  . Neck Pain    HPI Gerald Yang is a 25 y.o. male.   HPI  Patient presents with neck pain x 2 weeks. 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 Pain localized to posterior right lateral neck. He has take ibuprofen which temporarily improves pain although pain has not resolved. He denies pain radiating in the shoulder region. No known injury  Past Medical History:  Diagnosis Date  . ADHD (attention deficit hyperactivity disorder)    Inattentive type  . Allergic rhinitis due to pollen     Patient Active Problem List   Diagnosis Date Noted  . Routine general medical examination at a health care facility 11/20/2011  . Allergic rhinitis due to pollen   . ADHD, predominantly inattentive type 06/11/2010    Past Surgical History:  Procedure Laterality Date  . LIPOMA EXCISION  2012   in neck       Home Medications    Prior to Admission medications   Not on File    Family History Family History  Problem Relation Age of Onset  . Stroke Mother        TIA  . Hypertension Mother   . Mitral valve prolapse Father   . Allergies Father   . Allergies Sister   . Cancer Other        lung cancer--?asbestosis  . Diabetes Paternal Aunt   . Diabetes Paternal Grandmother 65  . Heart disease Neg Hx     Social History Social History   Tobacco Use  . Smoking status: Never Smoker  . Smokeless tobacco: Never Used  Substance Use Topics  . Alcohol use: No    Alcohol/week: 0.0 standard drinks  . Drug use: No     Allergies   Patient has no known allergies.   Review of Systems Review of Systems Pertinent negatives listed in HPI   Physical Exam Triage Vital Signs ED Triage Vitals  Enc Vitals Group     BP 05/08/20 1439 111/76     Pulse Rate 05/08/20 1439 89     Resp  05/08/20 1439 20     Temp 05/08/20 1439 98.7 F (37.1 C)     Temp src --      SpO2 05/08/20 1439 97 %     Weight --      Height --      Head Circumference --      Peak Flow --      Pain Score 05/08/20 1437 3     Pain Loc --      Pain Edu? --      Excl. in GC? --    No data found.  Updated Vital Signs BP 111/76   Pulse 89   Temp 98.7 F (37.1 C)   Resp 20   SpO2 97%   Visual Acuity Right Eye Distance:   Left Eye Distance:   Bilateral Distance:    Right Eye Near:   Left Eye Near:    Bilateral Near:     Physical Exam Constitutional:      Appearance: Normal appearance.  HENT:     Head: Normocephalic.  Cardiovascular:     Rate and Rhythm: Normal rate and regular rhythm.  Pulmonary:     Effort: Pulmonary effort is normal.  Breath sounds: Normal breath sounds.  Musculoskeletal:     Cervical back: Normal range of motion. Tenderness present. No rigidity.  Lymphadenopathy:     Cervical: No cervical adenopathy.  Skin:    Capillary Refill: Capillary refill takes less than 2 seconds.  Neurological:     General: No focal deficit present.     Mental Status: He is alert.     Gait: Gait normal.  Psychiatric:        Mood and Affect: Mood normal.        Behavior: Behavior normal.        Thought Content: Thought content normal.        Judgment: Judgment normal.      UC Treatments / Results  Labs (all labs ordered are listed, but only abnormal results are displayed) Labs Reviewed - No data to display  EKG   Radiology No results found.  Procedures Procedures (including critical care time)  Medications Ordered in UC Medications - No data to display  Initial Impression / Assessment and Plan / UC Course  I have reviewed the triage vital signs and the nursing notes.  Pertinent labs & imaging results that were available during my care of the patient were reviewed by me and considered in my medical decision making (see chart for details).    Acute neck  pain, no focal neurological concerns, patient palpable tenderness with full ROM. Treating as muscle strain. Treatment per discharge medications.  Final Clinical Impressions(s) / UC Diagnoses   Final diagnoses:  Musculoskeletal neck pain   Discharge Instructions   None    ED Prescriptions    Medication Sig Dispense Auth. Provider   predniSONE (DELTASONE) 20 MG tablet Take 1 tablet (20 mg total) by mouth daily with breakfast for 5 days. 5 tablet Bing Neighbors, FNP   tiZANidine (ZANAFLEX) 4 MG tablet Take 1 tablet (4 mg total) by mouth at bedtime. 20 tablet Bing Neighbors, FNP     PDMP not reviewed this encounter.   Bing Neighbors, FNP 05/08/20 [phone]

## 2020-07-04 ENCOUNTER — Other Ambulatory Visit: Payer: Self-pay

## 2020-07-04 ENCOUNTER — Encounter: Payer: Self-pay | Admitting: Family Medicine

## 2020-07-04 ENCOUNTER — Ambulatory Visit: Payer: BLUE CROSS/BLUE SHIELD | Admitting: Family Medicine

## 2020-07-04 DIAGNOSIS — L739 Follicular disorder, unspecified: Secondary | ICD-10-CM | POA: Insufficient documentation

## 2020-07-04 MED ORDER — MUPIROCIN 2 % EX OINT: 1.0000 "application " | TOPICAL_OINTMENT | Freq: Two times a day (BID) | CUTANEOUS | 0 refills | Status: AC

## 2020-07-04 NOTE — Progress Notes (Signed)
Subjective:    Patient ID: Gerald Yang, male    DOB: 11/06/1995, 25 y.o.   MRN: [id number]  This visit occurred during the SARS-CoV-2 public health emergency.  Safety protocols were in place, including screening questions prior to the visit, additional usage of staff PPE, and extensive cleaning of exam room while observing appropriate contact time as indicated for disinfecting solutions.   HPI 25 yo pt of Dr Alphonsus Sias presents for c/o lump in groin area  Wt Readings from Last 3 Encounters:  07/04/20 271 lb 4 oz (123 kg)  09/01/18 256 lb (116.1 kg)  01/14/16 246 lb (111.6 kg)   36.53 kg/m  Wife noticed a spot in testicle on the L side It itches  Does not hurt  Goes up and down in size   No burning to urinate  Never had anything like this before   On feet all day  Warm environment   No hx of jock itch  Has issues with sweat glands (this does not hurt)   Patient Active Problem List   Diagnosis Date Noted   Inflamed hair follicle 07/04/2020   Routine general medical examination at a health care facility 11/20/2011   Allergic rhinitis due to pollen    ADHD, predominantly inattentive type 06/11/2010   Past Medical History:  Diagnosis Date   ADHD (attention deficit hyperactivity disorder)    Inattentive type   Allergic rhinitis due to pollen    Past Surgical History:  Procedure Laterality Date   LIPOMA EXCISION  2012   in neck   Social History   Tobacco Use   Smoking status: Never   Smokeless tobacco: Never  Substance Use Topics   Alcohol use: No    Alcohol/week: 0.0 standard drinks   Drug use: No   Family History  Problem Relation Age of Onset   Stroke Mother        TIA   Hypertension Mother    Mitral valve prolapse Father    Allergies Father    Allergies Sister    Cancer Other        lung cancer--?asbestosis   Diabetes Paternal Aunt    Diabetes Paternal Grandmother 56   Heart disease Neg Hx    No Known Allergies Current Outpatient  Medications on File Prior to Visit  Medication Sig Dispense Refill   fexofenadine (ALLEGRA) 180 MG tablet Take 180 mg by mouth daily.     No current facility-administered medications on file prior to visit.     Review of Systems  Constitutional:  Negative for activity change, appetite change, fatigue, fever and unexpected weight change.  HENT:  Negative for congestion, rhinorrhea, sore throat and trouble swallowing.   Eyes:  Negative for pain, redness, itching and visual disturbance.  Respiratory:  Negative for cough, chest tightness, shortness of breath and wheezing.   Cardiovascular:  Negative for chest pain and palpitations.  Gastrointestinal:  Negative for abdominal pain, blood in stool, constipation, diarrhea and nausea.  Endocrine: Negative for cold intolerance, heat intolerance, polydipsia and polyuria.  Genitourinary:  Negative for difficulty urinating, dysuria, frequency, genital sores, penile discharge, scrotal swelling, testicular pain and urgency.  Musculoskeletal:  Negative for arthralgias, joint swelling and myalgias.  Skin:  Negative for pallor, rash and wound.  Neurological:  Negative for dizziness, tremors, weakness, numbness and headaches.  Hematological:  Negative for adenopathy. Does not bruise/bleed easily.  Psychiatric/Behavioral:  Negative for decreased concentration and dysphoric mood. The patient is not nervous/anxious.  Objective:   Physical Exam Exam conducted with a chaperone present.  Constitutional:      General: He is not in acute distress.    Appearance: Normal appearance. He is obese. He is not ill-appearing.  Eyes:     Conjunctiva/sclera: Conjunctivae normal.     Pupils: Pupils are equal, round, and reactive to light.  Cardiovascular:     Rate and Rhythm: Normal rate and regular rhythm.  Pulmonary:     Effort: Pulmonary effort is normal. No respiratory distress.  Genitourinary:    Pubic Area: No rash or pubic lice.      Penis: Normal and  circumcised. No tenderness, discharge or swelling.      Testes: Normal.        Right: Mass, tenderness, swelling, testicular hydrocele or varicocele not present.        Left: Mass, tenderness, swelling, testicular hydrocele or varicocele not present.     Epididymis:     Right: Normal.     Left: Normal.     Comments: See skin exam  Inflamed hair follicle L scrotal area  Skin:    General: Skin is warm and dry.     Comments: 0.5 to 1 cm lump in skin of L testicle close to the body that is soft/non tender and resembles inflamed hair follicle  No drainage or scab Unable to express any material    Neurological:     Mental Status: He is alert.  Psychiatric:        Mood and Affect: Mood normal.          Assessment & Plan:   Problem List Items Addressed This Visit       Musculoskeletal and Integument   Inflamed hair follicle    Pt notes h/o seb cysts/sweat gland problems in the past  Small in L proximal scrotum (no testicular mass noted)  Disc infection prevention  Adv soap and water cleanse bid  bactroban ointment bid (px)  Warm compress prn may also help  inst to watch for inc size/ pain or any redness or drainage  Update if not starting to improve in a week or if worsening

## 2020-07-04 NOTE — Patient Instructions (Addendum)
The area in question seems to be on the skin , resembling an inflamed hair follicle Keep it clean with soap and water Try the bactroban ointment twice daily  Use warm compresses   Update if not starting to improve in a week or if worsening   Watch for redness or increase in size or discomfort

## 2020-07-04 NOTE — Assessment & Plan Note (Signed)
Pt notes h/o seb cysts/sweat gland problems in the past  Small in L proximal scrotum (no testicular mass noted)  Disc infection prevention  Adv soap and water cleanse bid  bactroban ointment bid (px)  Warm compress prn may also help  inst to watch for inc size/ pain or any redness or drainage  Update if not starting to improve in a week or if worsening

## 2021-07-24 DIAGNOSIS — H18621 Keratoconus, unstable, right eye: Secondary | ICD-10-CM | POA: Diagnosis not present

## 2021-09-11 DIAGNOSIS — Z3141 Encounter for fertility testing: Secondary | ICD-10-CM | POA: Diagnosis not present

## 2022-05-09 DIAGNOSIS — Z6833 Body mass index (BMI) 33.0-33.9, adult: Secondary | ICD-10-CM | POA: Diagnosis not present

## 2022-05-09 DIAGNOSIS — E669 Obesity, unspecified: Secondary | ICD-10-CM | POA: Diagnosis not present

## 2022-05-09 DIAGNOSIS — L743 Miliaria, unspecified: Secondary | ICD-10-CM | POA: Diagnosis not present

## 2022-05-09 DIAGNOSIS — R030 Elevated blood-pressure reading, without diagnosis of hypertension: Secondary | ICD-10-CM | POA: Diagnosis not present

## 2022-05-13 DIAGNOSIS — H18621 Keratoconus, unstable, right eye: Secondary | ICD-10-CM | POA: Diagnosis not present

## 2022-09-24 DIAGNOSIS — H18621 Keratoconus, unstable, right eye: Secondary | ICD-10-CM | POA: Diagnosis not present

## 2022-10-01 DIAGNOSIS — H18621 Keratoconus, unstable, right eye: Secondary | ICD-10-CM | POA: Diagnosis not present

## 2023-07-01 ENCOUNTER — Encounter: Payer: Self-pay | Admitting: Physician Assistant

## 2024-03-02 ENCOUNTER — Ambulatory Visit: Payer: Self-pay | Admitting: Family Medicine
